# Patient Record
Sex: Male | Born: 1985 | Race: White | Hispanic: No | Marital: Married | Smoking: Light tobacco smoker
Health system: Southern US, Community
[De-identification: ages and names within clinical notes are randomized; demographics above are authoritative.]

## PROBLEM LIST (undated history)

## (undated) HISTORY — PX: HERNIA REPAIR: SHX51

---
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
## 2010-03-09 ENCOUNTER — Ambulatory Visit (HOSPITAL_COMMUNITY): Admission: RE | Admit: 2010-03-09 | Discharge: 2010-03-09 | Payer: Self-pay | Admitting: Surgery

## 2011-01-11 LAB — DIFFERENTIAL
Basophils Relative: 0 % (ref 0–1)
Eosinophils Absolute: 0.1 10*3/uL (ref 0.0–0.7)
Eosinophils Relative: 2 % (ref 0–5)
Lymphs Abs: 2.9 10*3/uL (ref 0.7–4.0)
Monocytes Relative: 11 % (ref 3–12)
Neutro Abs: 2.8 10*3/uL (ref 1.7–7.7)

## 2011-01-11 LAB — BASIC METABOLIC PANEL
CO2: 32 mEq/L (ref 19–32)
Chloride: 104 mEq/L (ref 96–112)
GFR calc Af Amer: >60 mL/min (ref >60)

## 2011-01-11 LAB — CBC
MCV: 87.3 fL (ref 78.0–100.0)
Platelets: 189 10*3/uL (ref 150–400)
RDW: 13.2 % (ref 11.5–15.5)
WBC: 6.6 10*3/uL (ref 4.0–10.5)

## 2014-10-18 ENCOUNTER — Encounter (HOSPITAL_COMMUNITY): Payer: Self-pay | Admitting: Family Medicine

## 2014-10-18 ENCOUNTER — Emergency Department (HOSPITAL_COMMUNITY): Payer: BC Managed Care – PPO

## 2014-10-18 ENCOUNTER — Emergency Department (HOSPITAL_COMMUNITY)
Admission: EM | Admit: 2014-10-18 | Discharge: 2014-10-18 | Disposition: A | Discharge status: Home / Self Care | Payer: BC Managed Care – PPO | Attending: Emergency Medicine | Admitting: Emergency Medicine

## 2014-10-18 DIAGNOSIS — S134XXA Sprain of ligaments of cervical spine, initial encounter: Secondary | ICD-10-CM | POA: Diagnosis not present

## 2014-10-18 DIAGNOSIS — Z720 Tobacco use: Secondary | ICD-10-CM | POA: Insufficient documentation

## 2014-10-18 DIAGNOSIS — S4991XA Unspecified injury of right shoulder and upper arm, initial encounter: Secondary | ICD-10-CM | POA: Diagnosis not present

## 2014-10-18 DIAGNOSIS — Y9389 Activity, other specified: Secondary | ICD-10-CM | POA: Insufficient documentation

## 2014-10-18 DIAGNOSIS — R110 Nausea: Secondary | ICD-10-CM | POA: Diagnosis not present

## 2014-10-18 DIAGNOSIS — Y92410 Unspecified street and highway as the place of occurrence of the external cause: Secondary | ICD-10-CM | POA: Insufficient documentation

## 2014-10-18 DIAGNOSIS — S0990XA Unspecified injury of head, initial encounter: Secondary | ICD-10-CM | POA: Insufficient documentation

## 2014-10-18 DIAGNOSIS — Y998 Other external cause status: Secondary | ICD-10-CM | POA: Diagnosis not present

## 2014-10-18 DIAGNOSIS — S199XXA Unspecified injury of neck, initial encounter: Secondary | ICD-10-CM | POA: Diagnosis present

## 2014-10-18 DIAGNOSIS — S139XXA Sprain of joints and ligaments of unspecified parts of neck, initial encounter: Secondary | ICD-10-CM

## 2014-10-18 DIAGNOSIS — S20312A Abrasion of left front wall of thorax, initial encounter: Secondary | ICD-10-CM | POA: Diagnosis not present

## 2014-10-18 MED ORDER — IBUPROFEN 600 MG PO TABS: 600.0000 mg | ORAL_TABLET | Freq: Four times a day (QID) | ORAL | Status: DC | PRN

## 2014-10-18 MED ORDER — CYCLOBENZAPRINE HCL 10 MG PO TABS: 10.0000 mg | ORAL_TABLET | Freq: Two times a day (BID) | ORAL | Status: DC | PRN

## 2014-10-18 MED ORDER — IBUPROFEN 400 MG PO TABS
800.0000 mg | ORAL_TABLET | Freq: Once | ORAL | Status: AC
  Administered 2014-10-18: 800 mg via ORAL
  Filled 2014-10-18: qty 2

## 2014-10-18 MED ORDER — OXYCODONE-ACETAMINOPHEN 5-325 MG PO TABS
1.0000 | ORAL_TABLET | Freq: Once | ORAL | Status: AC
  Administered 2014-10-18: 1 via ORAL
  Filled 2014-10-18: qty 1
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
## 2014-10-18 NOTE — ED Notes (Signed)
Patient transported to X-ray 

## 2014-10-18 NOTE — Discharge Instructions (Signed)
Cervical Sprain °A cervical sprain is an injury in the neck in which the strong, fibrous tissues (ligaments) that connect your neck bones stretch or tear. Cervical sprains can range from mild to severe. Severe cervical sprains can cause the neck vertebrae to be unstable. This can lead to damage of the spinal cord and can result in serious nervous system problems. The amount of time it takes for a cervical sprain to get better depends on the cause and extent of the injury. Most cervical sprains heal in 1 to 3 weeks. °CAUSES  °Severe cervical sprains may be caused by:  °· Contact sport injuries (such as from football, rugby, wrestling, hockey, auto racing, gymnastics, diving, martial arts, or boxing).   °· Motor vehicle collisions.   °· Whiplash injuries. This is an injury from a sudden forward and backward whipping movement of the head and neck.  °· Falls.   °Mild cervical sprains may be caused by:  °· Being in an awkward position, such as while cradling a telephone between your ear and shoulder.   °· Sitting in a chair that does not offer proper support.   °· Working at a poorly designed computer station.   °· Looking up or down for long periods of time.   °SYMPTOMS  °· Pain, soreness, stiffness, or a burning sensation in the front, back, or sides of the neck. This discomfort may develop immediately after the injury or slowly, 24 hours or more after the injury.   °· Pain or tenderness directly in the middle of the back of the neck.   °· Shoulder or upper back pain.   °· Limited ability to move the neck.   °· Headache.   °· Dizziness.   °· Weakness, numbness, or tingling in the hands or arms.   °· Muscle spasms.   °· Difficulty swallowing or chewing.   °· Tenderness and swelling of the neck.   °DIAGNOSIS  °Most of the time your health care provider can diagnose a cervical sprain by taking your history and doing a physical exam. Your health care provider will ask about previous neck injuries and any known neck  problems, such as arthritis in the neck. X-rays may be taken to find out if there are any other problems, such as with the bones of the neck. Other tests, such as a CT scan or MRI, may also be needed.  °TREATMENT  °Treatment depends on the severity of the cervical sprain. Mild sprains can be treated with rest, keeping the neck in place (immobilization), and pain medicines. Severe cervical sprains are immediately immobilized. Further treatment is done to help with pain, muscle spasms, and other symptoms and may include: °· Medicines, such as pain relievers, numbing medicines, or muscle relaxants.   °· Physical therapy. This may involve stretching exercises, strengthening exercises, and posture training. Exercises and improved posture can help stabilize the neck, strengthen muscles, and help stop symptoms from returning.   °HOME CARE INSTRUCTIONS  °· Put ice on the injured area.   °¨ Put ice in a plastic bag.   °¨ Place a towel between your skin and the bag.   °¨ Leave the ice on for 15-20 minutes, 3-4 times a day.   °· If your injury was severe, you may have been given a cervical collar to wear. A cervical collar is a two-piece collar designed to keep your neck from moving while it heals. °¨ Do not remove the collar unless instructed by your health care provider. °¨ If you have long hair, keep it outside of the collar. °¨ Ask your health care provider before making any adjustments to your collar. Minor   adjustments may be required over time to improve comfort and reduce pressure on your chin or on the back of your head. °¨ If you are allowed to remove the collar for cleaning or bathing, follow your health care provider's instructions on how to do so safely. °¨ Keep your collar clean by wiping it with mild soap and water and drying it completely. If the collar you have been given includes removable pads, remove them every 1-2 days and hand wash them with soap and water. Allow them to air dry. They should be completely  dry before you wear them in the collar. °¨ If you are allowed to remove the collar for cleaning and bathing, wash and dry the skin of your neck. Check your skin for irritation or sores. If you see any, tell your health care provider. °¨ Do not drive while wearing the collar.   °· Only take over-the-counter or prescription medicines for pain, discomfort, or fever as directed by your health care provider.   °· Keep all follow-up appointments as directed by your health care provider.   °· Keep all physical therapy appointments as directed by your health care provider.   °· Make any needed adjustments to your workstation to promote good posture.   °· Avoid positions and activities that make your symptoms worse.   °· Warm up and stretch before being active to help prevent problems.   °SEEK MEDICAL CARE IF:  °· Your pain is not controlled with medicine.   °· You are unable to decrease your pain medicine over time as planned.   °· Your activity level is not improving as expected.   °SEEK IMMEDIATE MEDICAL CARE IF:  °· You develop any bleeding. °· You develop stomach upset. °· You have signs of an allergic reaction to your medicine.   °· Your symptoms get worse.   °· You develop new, unexplained symptoms.   °· You have numbness, tingling, weakness, or paralysis in any part of your body.   °MAKE SURE YOU:  °· Understand these instructions. °· Will watch your condition. °· Will get help right away if you are not doing well or get worse. °Document Released: 08/07/2007 Document Revised: 10/15/2013 Document Reviewed: 04/17/2013 °ExitCare® Patient Information ©2015 [employer]. This information is not intended to replace advice given to you by your health care provider. Make sure you discuss any questions you have with your health care provider. ° ° °Emergency Department Resource Guide °1) Find a Doctor and Pay Out of Pocket °Although you won't have to find out who is covered by your insurance plan, it is a good idea to ask  around and get recommendations. You will then need to call the office and see if the doctor you have chosen will accept you as a new patient and what types of options they offer for patients who are self-pay. Some doctors offer discounts or will set up payment plans for their patients who do not have insurance, but you will need to ask so you aren't surprised when you get to your appointment. ° °2) Contact Your Local Health Department °Not all health departments have doctors that can see patients for sick visits, but many do, so it is worth a call to see if yours does. If you don't know where your local health department is, you can check in your phone book. The CDC also has a tool to help you locate your state's health department, and many state websites also have listings of all of their local health departments. ° °3) Find a Walk-in Clinic °If your illness is not   likely to be very severe or complicated, you may want to try a walk in clinic. These are popping up all over the country in pharmacies, drugstores, and shopping centers. They're usually staffed by nurse practitioners or physician assistants that have been trained to treat common illnesses and complaints. They're usually fairly quick and inexpensive. However, if you have serious medical issues or chronic medical problems, these are probably not your best option. ° °No Primary Care Doctor: °- Call [facility] at  832-8000 - they can help you locate a primary care doctor that  accepts your insurance, provides certain services, etc. °- Physician Referral Service- 1-800-533-3463 ° °Chronic Pain Problems: °Organization         Address  Phone   Notes  °[facility]  (336) 297-2271 Patients need to be referred by their primary care doctor.  ° °Medication Assistance: °Organization         Address  Phone   Notes  °[place] [facility] [address] °[place], [place] [address] (336) 641-8030 --Must be a  resident of [place] °-- Must have NO insurance coverage whatsoever (no Medicaid/ Medicare, etc.) °-- The pt. MUST have a primary care doctor that directs their care regularly and follows them in the community °  °[facility]  (866) 331-1348   °[employer]  (888) 892-1162   ° °Agencies that provide inexpensive medical care: °Organization         Address  Phone   Notes  °[facility]  (336) 832-8035   °[facility]    (336) 832-7272   °[facility] [address] °[place], [place] [address] (336) 832-4777   °[facility] [address], °[place] (336) 271-4999   °[facility]    (336) 373-0678   °[facility]    (336) 272-1050   °[facility] ° [address], [place] Phone:  (336) 832-4444, Fax:  (336) 832-4440 Hours of Operation:  9 am - 6 pm, M-F.  Also accepts Medicaid/Medicare and self-pay.  °[facility] ° [address], [place] Phone: (336) [phone], Fax: (336) [phone]. Hours of Operation:  8:30 am - 5:30 pm, M-F.  Also accepts Medicaid and self-pay.  °[facility] [place] [address], [place] Phone: (336) 878-6027   °[facility] [address], [place], [place] (336)723-1848, Ext. 123 Mondays & Thursdays: 7-9 AM.  First 15 patients are seen on a first come, first serve basis. °  ° °Medicaid-accepting [place] Providers: ° °Organization         Address  Phone   Notes  °[facility] [address], [place] (336) 641-2100 Also accepts self-pay patients.  °[facility] [address], [place] ° (336) 856-9996   °[facility] [place] [facility] [address] [place] [address], [place] (336) 288-8857   °[facility] [address] [place] [address], [place] (336) 299-7000   °Veita Bland [address], [place]  ° (336) 373-1557 Only accepts  [place] Access Medicaid patients after they have their name applied to their card.  ° °Self-Pay (no insurance) in [place]: ° °Organization         Address  Phone   Notes  °Sickle Cell Patients, [facility] [address],  (336) 832-1970   °[facility] [address], [place] (336) 832-4400   °[facility] ° [address], [place] (336) 992-4800   °[facility]/Dr. Osei-Bonsu ° [address] [place] [address], [place] or [address], [place] (336) [phone] Phone number for both [place] and [place] locations is the same.  °[facility] [address], [place] (336) 299-0000   °[facility] [address] [place] [address], [place] or [address] (336) 852-7530 °(336) 878-2260   °[facility] [address], [place] (336) 350-1642, phone; (336) 294-5005, fax Sees patients 1st and 3rd Saturday of every month.  Must not qualify for public or private insurance (i.e. Medicaid, Medicare, [employer] Health Choice, Veterans' Benefits) • Household income should be no more than 200% of the poverty level •The clinic cannot treat you if you are pregnant or think you are pregnant • Sexually transmitted diseases are not treated at the clinic.  ° ° °Dental Care: °Organization         Address  Phone  Notes  °[facility] [address], [place] (336) 641-6152 Accepts children up to age 21 who are enrolled in Medicaid or [place] Health Choice; pregnant women with a Medicaid card; and children who have applied for Medicaid or [place] Health Choice, but were declined, whose parents can pay a reduced fee at time of service.  °[facility] [place]  [address], [place] (336) 641-7733 Accepts children up to age 21 who are enrolled in Medicaid or [place] Health Choice; pregnant women  with a Medicaid card; and children who have applied for Medicaid or [place] Health Choice, but were declined, whose parents can pay a reduced fee at time of service.  °[facility] ° [address], [place] (336) 641-4533 Patients are seen by appointment only. Walk-ins are not accepted. [facility] will see patients 18 years of age and older. °Monday - Tuesday (8am-5pm) °Most Wednesdays (8:30-5pm) °$30 per visit, cash only  °[facility] ° [address], [place] (336) 641-4533 Patients are seen by appointment only. Walk-ins are not accepted. [facility] will see patients 18 years of age and older. °One Wednesday Evening (Monthly: Volunteer Based).  $30 per visit, cash only  °[facility]  (919) [phone] for adults; Children under age 4, call Graduate Pediatric Dentistry at ([phone]) [phone]. Children aged 4-14, please call ([phone]) [phone] to request a pediatric application. ° Dental services are provided in all areas of dental care including fillings, crowns and bridges, complete and partial dentures, implants, gum treatment, root canals, and extractions. Preventive care is also provided. Treatment is provided to both adults and children. °Patients are selected via a lottery and there is often a waiting list. °  °[facility] [address], °[place] ° (336) 763-8833 [email or web address] °  °[facility] [address], [place],  (336)723-1848, Ext. 123 Second and Fourth Thursday of each month, opens at 6:30 AM; Clinic ends at 9 AM.  Patients are seen on a first-come first-served basis, and a limited number are seen during each clinic.  ° °[facility] ° [address], [place],  (336) 723-7904   Eligibility Requirements °You must have lived in [place], [place], or [place] counties for at least the last three months. °    You cannot be eligible for state or federal sponsored healthcare  insurance, including Veterans Administration, Medicaid, or Medicare. °  You generally cannot be eligible for healthcare insurance through your employer.  °  How to apply: °Eligibility screenings are held every Tuesday and Wednesday afternoon from 1:00 pm until 4:00 pm. You do not need an appointment for the interview!  °[facility] [address], [place], [place] 336-631-2330   °[facility]  336-342-8273   °[facility]  336-703-3100   °[facility]  336-570-6415   ° °Behavioral Health Resources in the Community: °Intensive Outpatient Programs °Organization         Address  Phone  Notes  °[facility] [address], [place], [place] 336-878-6098   °[facility] [address], [place], [place] 336-832-9800   °[facility] [address], [place], [place] ° 336-882-2125   °[facility] [address],  °[place], [place] 1-800-853-5163 or 336-641-4981   °Substance Abuse Resources °Organization         Address  Phone  Notes  °[facility]  336-882-2125   °[facility]  336-784-9470   °[facility]  336-285-9073   °[facility]  336-845-3988   °Residential & Outpatient Substance Abuse Program  1-800-659-3381   °Psychological Services °Organization         Address  Phone  Notes  °[facility]  336- 832-9600   °[facility]  336- 378-7881   °[facility] [address], [place] 1-800-853-5163 or 336-641-4981   ° °Mobile Crisis Teams °Organization         Address  Phone  Notes  °[facility], Mobile Crisis Care Unit  1-877-626-1772   °Assertive °[facility] ° [address] [place], [place] 336-834-9664   °Sharon DeEsch [address] °[place] 336-554-5454   ° °Self-Help/Support Groups °Organization         Address  Phone             Notes  °[facility] - variety of support groups  336- 373-1402 Call for more information  °Narcotics Anonymous (NA), [facility] [address], °[place]  2 meetings at this location  ° °Residential Treatment Programs °Organization         Address  Phone  Notes  °[facility] [address],    °[place]  1-866-801-8205   °[facility] ° [address], [place], [place] 704-293-8524   °[facility] [address], [place] 336-845-3988 Admissions: 8am-3pm M-F  °[facility] [address]    °[place], [place] 336-841-1104   °[facility] [address], [place], [place] 336-379-7146   °[facility] [address]  °[place], [place] 336-285-9073   °[facility] Intensive Outpatient [address], [place], [place] 336-852-3033   °[facility] [address]  °[place], [place] 1-877-615-2722 or 336-784-9470   °[facility] [address] [place], [place] 336-227-7417 Accepts Medicaid  °[facility] [address]  °  1-800-659-3381 Substance Abuse/Addiction Treatment  ° °[place] Behavioral Health Resources °Organization         Address  Phone  Notes  °[facility]  (888) 581-9988   °Julie Brannon, PhD [address] [place], [place]   (336) 349-5553 or (336) 951-0000   °[facility]   [address] °[place], [place] (336) 349-4454   °[facility] [address], [place], [place] (336) 342-8316 Insurance/Medicaid/sponsorship through [facility]  °[facility] [address]                                    [place], [place] (336) 342-8316 Therapy/tele-psych/case  °[facility] [address]  ° [place], [place] (336) 349-2233    °Dr. Arfeen  (336) 349-4544   °[facility] [place]  [employer] [place] [facility] 1) [address],  [place] °2) [address], [place] °3)  [address], [place] (336) 349-3220 °(336) 342-7768 ° °(336) 342-8140   °[place] [facility] (336) 342-1394 or (336) 342-3537 (After Hours)    ° ° ° °

## 2014-10-18 NOTE — ED Notes (Signed)
Discharge instructions reviewed with patient.
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
## 2015-03-04 ENCOUNTER — Other Ambulatory Visit: Payer: Self-pay | Admitting: Internal Medicine

## 2015-03-04 ENCOUNTER — Ambulatory Visit
Admission: RE | Admit: 2015-03-04 | Discharge: 2015-03-04 | Disposition: A | Discharge status: Home / Self Care | Payer: BLUE CROSS/BLUE SHIELD | Source: Ambulatory Visit | Attending: Internal Medicine | Admitting: Internal Medicine

## 2015-03-04 ENCOUNTER — Ambulatory Visit: Payer: Self-pay

## 2015-03-04 DIAGNOSIS — R0781 Pleurodynia: Secondary | ICD-10-CM
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
## 2015-07-22 ENCOUNTER — Ambulatory Visit (INDEPENDENT_AMBULATORY_CARE_PROVIDER_SITE_OTHER): Payer: BLUE CROSS/BLUE SHIELD | Admitting: Internal Medicine

## 2015-07-22 ENCOUNTER — Encounter: Payer: Self-pay | Admitting: Internal Medicine

## 2015-07-22 VITALS — BP 112/70 | HR 80 | Ht 70.5 in | Wt 208.4 lb

## 2015-07-22 DIAGNOSIS — R05 Cough: Secondary | ICD-10-CM | POA: Diagnosis not present

## 2015-07-22 DIAGNOSIS — R0600 Dyspnea, unspecified: Secondary | ICD-10-CM | POA: Diagnosis not present

## 2015-07-22 DIAGNOSIS — R058 Other specified cough: Secondary | ICD-10-CM | POA: Insufficient documentation

## 2015-07-22 NOTE — Assessment & Plan Note (Signed)
07/22/2015  Walked RA x 3 laps @ 185 ft each stopped due to  End of study, fast pace, no sob or desat   - spirometry 07/22/2015 wnl including fef 25-75   Symptoms are markedly disproportionate to objective findings and not clear this is a lung problem but pt does appear to have difficult airway management issues. DDX of  difficult airways management all start with A and  include Adherence, Ace Inhibitors, Acid Reflux, Active Sinus Disease, Alpha 1 Antitripsin deficiency, Anxiety masquerading as Airways dz,  ABPA,  allergy(esp in young), Aspiration (esp in elderly), Adverse effects of meds,  Active smokers, A bunch of PE's (a small clot burden can't cause this syndrome unless there is already severe underlying pulm or vascular dz with poor reserve) plus two Bs  = Bronchiectasis and Beta blocker use..and one C= CHF  ? Acid (or non-acid) GERD > always difficult to exclude as up to 75% of pts in some series report no assoc GI/ Heartburn symptoms and he's had them for years > rec max (24h)  acid suppression and diet restrictions/ reviewed and instructions given in writing.   Explained the natural history of uri (which may have been the sudder trigger)  and why it's necessary in patients at risk to treat GERD aggressively - at least  short term -   to reduce risk of evolving cyclical cough initially  triggered by epithelial injury and a heightened sensitivty to the effects of any upper airway irritants,  most importantly acid - related - then perpetuated by epithelial injury related to the cough itself as the upper airway collapses on itself.  That is, the more sensitive the epithelium becomes once it is damaged by the virus, the more the ensuing irritability> the more the cough, the more the secondary reflux (especially in those prone to reflux) the more the irritation of the sensitive mucosa and so on in a  Classic cyclical pattern.    rec max gerd rx/ gi f/u and then consider further pulmonary w/u eg HRCT for  bronchiectasis assoc with GERD p GI rx complete ? Needs NF?

## 2015-07-22 NOTE — Assessment & Plan Note (Signed)
Classic Upper airway cough syndrome, so named because it's frequently impossible to sort out how much is  CR/sinusitis with freq throat clearing (which can be related to primary GERD)   vs  causing  secondary (" extra esophageal")  GERD from wide swings in gastric pressure that occur with throat clearing, often  promoting self use of mint and menthol lozenges that reduce the lower esophageal sphincter tone and exacerbate the problem further in a cyclical fashion.   These are the same pts (now being labeled as having "irritable larynx syndrome" by some cough centers) who not infrequently have a history of having failed to tolerate ace inhibitors,  dry powder inhalers or biphosphonates or report having atypical reflux symptoms that don't respond to standard doses of PPI , and are easily confused as having aecopd or asthma flares by even experienced allergists/ pulmonologists.   For now rec focus on gerd rx/ f/u prn p gi eval  Total time = 42m review case with pt/ discussion/ counseling/ giving and going over instructions (see avs)

## 2015-07-22 NOTE — Patient Instructions (Signed)
No evidence of asthma at all which is consistent with reflux/ spasm of the vocal cords (reflex to reflux)  Try change the nexium to 40 mg Take 30-60 min before first meal of the day and take pepcid 20 mg after supper or  at bedtime  GERD (REFLUX)  is an extremely common cause of respiratory symptoms just like yours , many times with no obvious heartburn at all.    It can be treated with medication, but also with lifestyle changes including elevation of the head of your bed (ideally with 6 inch  bed blocks),  Smoking cessation, avoidance of late meals, excessive alcohol, and avoid fatty foods, chocolate, peppermint, colas, red wine, and acidic juices such as orange juice.  NO MINT OR MENTHOL PRODUCTS SO NO COUGH DROPS  USE SUGARLESS CANDY INSTEAD (Jolley ranchers or Stover's or Life Savers) or even ice chips will also do - the key is to swallow to prevent all throat clearing. NO OIL BASED VITAMINS - use powdered substitutes.    Follow up with GI doctor nin 2 weeks if not better   .

## 2015-07-22 NOTE — Progress Notes (Signed)
Subjective:    Patient ID: Jordan Skinner, male    DOB: 11-30-1985     MRN: [id number]  HPI  57 yowm cigar smoker with chronic dysphagia  Dating all the way back to childhood worse assoc with HB x 2011 eval by [facility] / [facility] mid Sept 2016 > changed to nexium 20 mg bid before meals and referred to pulmonary clinic 07/22/2015 for eval of sob/ cough by Dr Hazle Nordmann    07/22/2015 1st [facility] office visit/ [facility]   Chief Complaint  Patient presents with  . Pulmonary Consult    Referred by Dr Gust Brooms. Pt c/o cough and SOB for the past 2 1/2 months.  He states sometimes feels tightness in his chest and notices burning in chest first thing in the am. Cough is usually non prod.   already on ppi x one month  Abruptly ill 2.5 m prior to OV woke up with it one morning ? With a bad cold and present ever since but less severe/ coughing > works hard to cough up thick mucus/ worse with voice use, urge to clear throat with ex but    Actual ex tol is good.  No obvious other patterns in day to day or daytime variabilty or assoc  cp or chest tightness, subjective wheeze or  overt sinus symptoms. No unusual exp hx or h/o childhood pna/ asthma or knowledge of premature birth.  Sleeping ok without nocturnal  or early am exacerbation  of respiratory  c/o's or need for noct saba. Also denies any obvious fluctuation of symptoms with weather or environmental changes or other aggravating or alleviating factors except as outlined above   Current Medications, Allergies, Complete Past Medical History, Past Surgical History, Family History, and Social History were reviewed in [employer] record.           Review of Systems  Constitutional: Negative for fever, chills, activity change, appetite change and unexpected weight change.  HENT: Positive for sore throat. Negative for congestion, dental problem, postnasal drip, rhinorrhea, sneezing, trouble swallowing and voice change.     Eyes: Negative for visual disturbance.  Respiratory: Positive for cough and shortness of breath. Negative for choking.   Cardiovascular: Negative for chest pain and leg swelling.  Gastrointestinal: Negative for nausea, vomiting and abdominal pain.  Genitourinary: Negative for difficulty urinating.       Indigestion Acid heartburn  Musculoskeletal: Negative for arthralgias.  Skin: Negative for rash.  Psychiatric/Behavioral: Negative for behavioral problems and confusion.       Objective:   Physical Exam  amb wm with classic voice fatigue/ throat clearing   Wt Readings from Last 3 Encounters:  07/22/15 208 lb 6.4 oz (94.53 kg)  10/18/14 203 lb (92.08 kg)    Vital signs reviewed   HEENT: nl dentition, turbinates,  With min cobblestoning s pnd of oropharynx. Nl external ear canals without cough reflex   NECK :  without JVD/Nodes/TM/ nl carotid upstrokes bilaterally   LUNGS: no acc muscle use, pseudowheeze resolves with plm    CV:  RRR  no s3 or murmur or increase in P2, no edema   ABD:  soft and nontender with nl excursion in the supine position. No bruits or organomegaly, bowel sounds nl  MS:  warm without deformities, calf tenderness, cyanosis or clubbing  SKIN: warm and dry without lesions    NEURO:  alert, approp, no deficits     I personally reviewed images and agree with radiology  impression as follows:  CXR:  03/04/15 Three views of the right ribs submitted. No acute infiltrate or pulmonary edema. No right rib fracture is identified. There is no pneumothorax.       Assessment & Plan:
